# Patient Record
Sex: Male | Born: 2005 | Race: Black or African American | Hispanic: No | Marital: Single
Health system: Southern US, Community
[De-identification: ages and names within clinical notes are randomized; demographics above are authoritative.]

## PROBLEM LIST (undated history)

## (undated) DIAGNOSIS — F329 Major depressive disorder, single episode, unspecified: Secondary | ICD-10-CM

## (undated) DIAGNOSIS — H539 Unspecified visual disturbance: Secondary | ICD-10-CM

## (undated) DIAGNOSIS — F909 Attention-deficit hyperactivity disorder, unspecified type: Secondary | ICD-10-CM

## (undated) DIAGNOSIS — Z87898 Personal history of other specified conditions: Secondary | ICD-10-CM

## (undated) DIAGNOSIS — F419 Anxiety disorder, unspecified: Secondary | ICD-10-CM

## (undated) DIAGNOSIS — T7840XA Allergy, unspecified, initial encounter: Secondary | ICD-10-CM

## (undated) DIAGNOSIS — F913 Oppositional defiant disorder: Secondary | ICD-10-CM

## (undated) DIAGNOSIS — F32A Depression, unspecified: Secondary | ICD-10-CM

## (undated) DIAGNOSIS — R4689 Other symptoms and signs involving appearance and behavior: Secondary | ICD-10-CM

## (undated) HISTORY — DX: Attention-deficit hyperactivity disorder, unspecified type: F90.9

## (undated) NOTE — *Deleted (*Deleted)
Emergency Medicine Provider Progress Note  Patient seen on rounds today  [name] is a 53 y.o. male who initially presented to the ED for complaints of Psychiatric Evaluation   Currently, the patient is resting in bed in no acute distress.  ED Course   10:47 AM   Vitals  BP (!) 134/80 (BP Location: Right Arm)   Pulse 87   Temp (!) 97.5 F (36.4 C) (Oral)   Resp 16   Wt 140 lb 14 oz (63.9 kg)   SpO2 99%   Physical Exam Constitutional:      General: He is not in acute distress.    Appearance: Normal appearance. He is not ill-appearing.  Cardiovascular:     Rate and Rhythm: Normal rate.  Pulmonary:     Effort: Pulmonary effort is normal.  Neurological:     General: No focal deficit present.     Mental Status: He is alert.  Psychiatric:        Mood and Affect: Mood normal.        Behavior: Behavior normal.      Labs    Results for orders placed or performed during the hospital encounter of 07/17/20  Resp Panel by RT PCR (RSV, Flu A&B, Covid) - Nasopharyngeal Swab   Specimen: Nasopharyngeal Swab  Result Value Ref Range   SARS Coronavirus 2 by RT PCR NEGATIVE NEGATIVE   Influenza A by PCR NEGATIVE NEGATIVE   Influenza B by PCR NEGATIVE NEGATIVE   Respiratory Syncytial Virus by PCR NEGATIVE NEGATIVE  Urine rapid drug screen (hosp performed)  Result Value Ref Range   Opiates NONE DETECTED NONE DETECTED   Cocaine NONE DETECTED NONE DETECTED   Benzodiazepines NONE DETECTED NONE DETECTED   Amphetamines NONE DETECTED NONE DETECTED   Tetrahydrocannabinol NONE DETECTED NONE DETECTED   Barbiturates NONE DETECTED NONE DETECTED  Salicylate level  Result Value Ref Range   Salicylate Lvl <7.0 (L) 7.0 - 30.0 mg/dL  Comprehensive metabolic panel  Result Value Ref Range   Sodium 139 135 - 145 mmol/L   Potassium 4.2 3.5 - 5.1 mmol/L   Chloride 106 98 - 111 mmol/L   CO2 26 22 - 32 mmol/L   Glucose, Bld 98 70 - 99 mg/dL   BUN 9 4 - 18 mg/dL   Creatinine, Ser 1.61 0.50 -  1.00 mg/dL   Calcium 9.2 8.9 - 09.6 mg/dL   Total Protein 6.6 6.5 - 8.1 g/dL   Albumin 4.0 3.5 - 5.0 g/dL   AST 18 15 - 41 U/L   ALT 10 0 - 44 U/L   Alkaline Phosphatase 131 52 - 171 U/L   Total Bilirubin 0.8 0.3 - 1.2 mg/dL   GFR, Estimated NOT CALCULATED >60 mL/min   Anion gap 7 5 - 15  Acetaminophen level  Result Value Ref Range   Acetaminophen (Tylenol), Serum <10 (L) 10 - 30 ug/mL  Ethanol  Result Value Ref Range   Alcohol, Ethyl (B) <10 <10 mg/dL  CBC with Diff  Result Value Ref Range   WBC 6.1 4.5 - 13.5 K/uL   RBC 4.62 3.80 - 5.70 MIL/uL   Hemoglobin 13.5 12.0 - 16.0 g/dL   HCT 04.5 36 - 49 %   MCV 85.3 78.0 - 98.0 fL   MCH 29.2 25.0 - 34.0 pg   MCHC 34.3 31.0 - 37.0 g/dL   RDW 40.9 81.1 - 91.4 %   Platelets 211 150 - 400 K/uL   nRBC 0.0 0.0 - 0.2 %  Neutrophils Relative % 58 %   Neutro Abs 3.5 1.7 - 8.0 K/uL   Lymphocytes Relative 24 %   Lymphs Abs 1.4 1.1 - 4.8 K/uL   Monocytes Relative 13 %   Monocytes Absolute 0.8 0.2 - 1.2 K/uL   Eosinophils Relative 4 %   Eosinophils Absolute 0.3 0.0 - 1.2 K/uL   Basophils Relative 1 %   Basophils Absolute 0.0 0.0 - 0.1 K/uL   Immature Granulocytes 0 %   Abs Immature Granulocytes 0.02 0.00 - 0.07 K/uL   No results found.    Radiology  @ENCIMAGES @   Plan  Current plan is for ***.  There are no questions and answers to display.     Lewis Moccasin MD   I,Hamilton Brunilda Payor, acting as a scribe for Lewis Moccasin MD, have documented all relevant information on their behalf and as directed by while in their presence.

---
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
## 2023-04-22 IMAGING — US US RENAL
1 series · 14 of 25 positions shown · non-contrast
Comparison: None.

CLINICAL DATA: Hematuria.

EXAM:
RENAL / URINARY TRACT ULTRASOUND COMPLETE

[Series 1: [facility] renal · 14 of 44 slices shown]
[im 1/44]
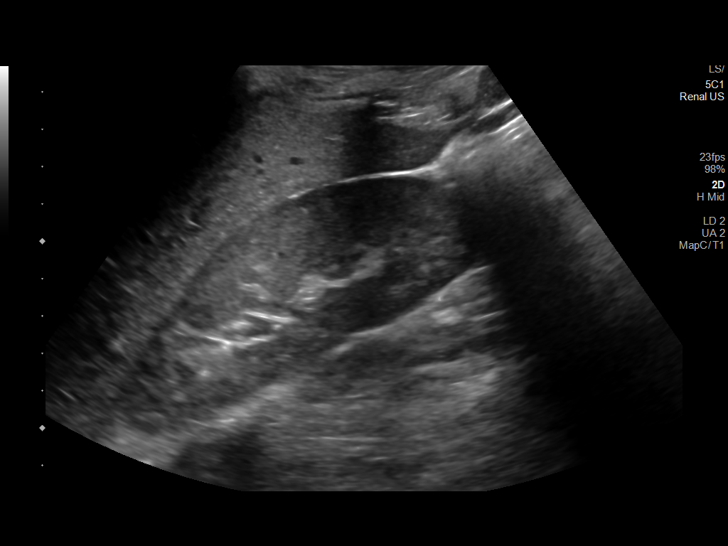
[im 4/44]
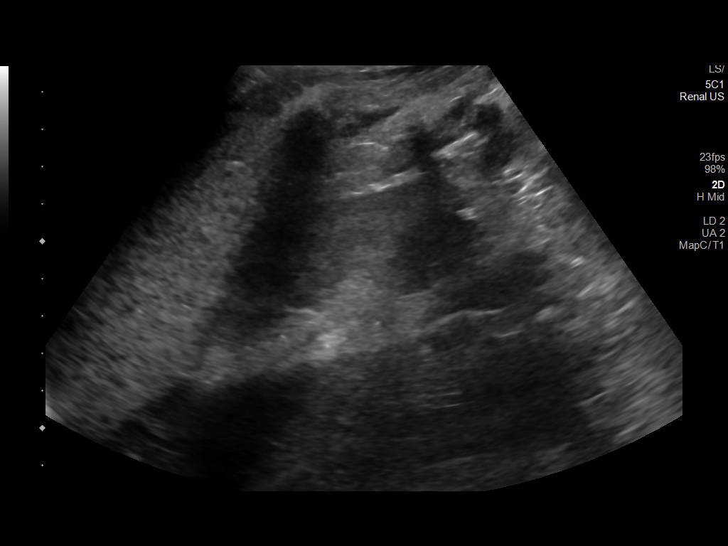
[im 8/44]
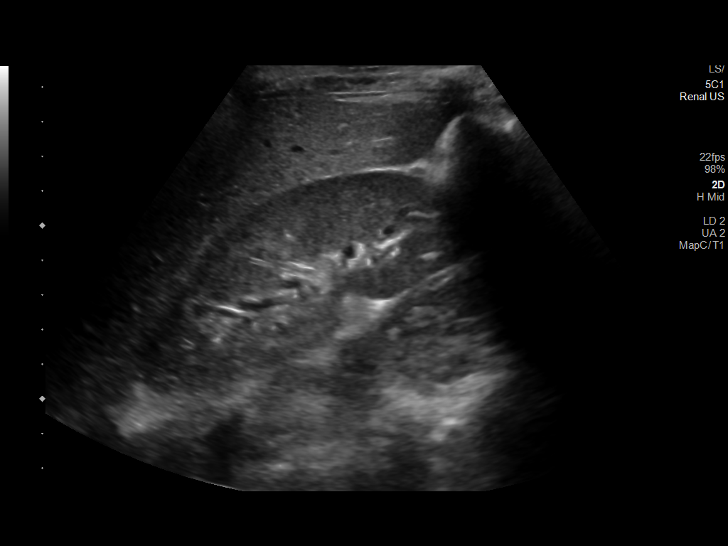
[im 11/44]
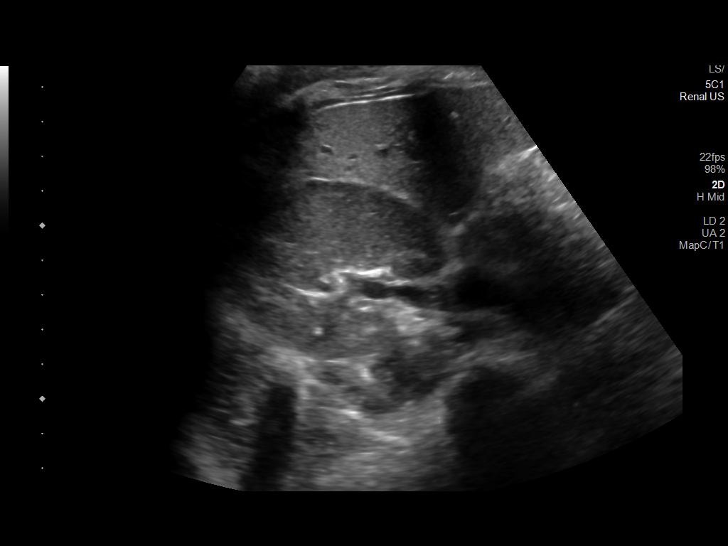
[im 15/44]
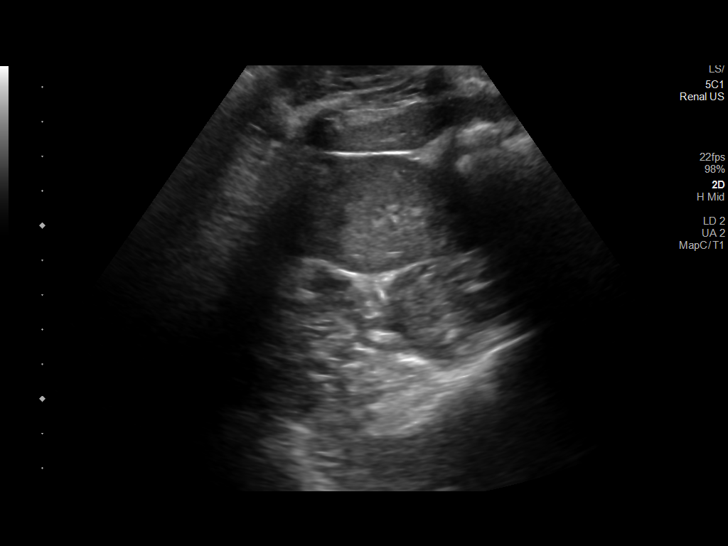
[im 17/44]
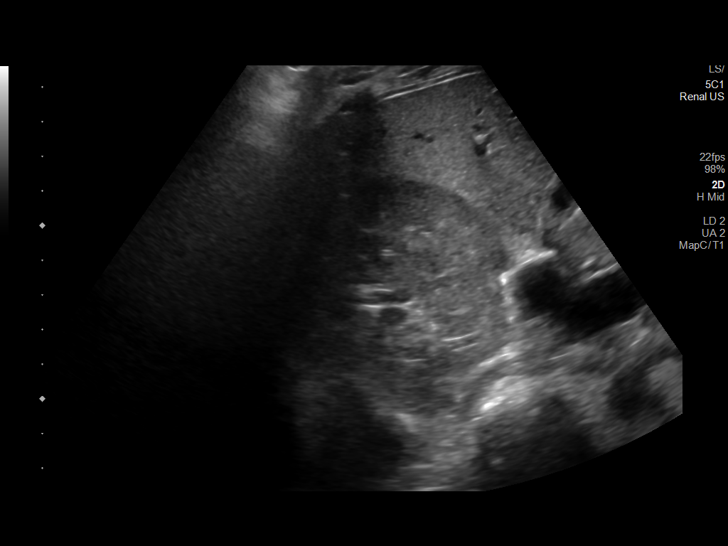
[im 20/44]
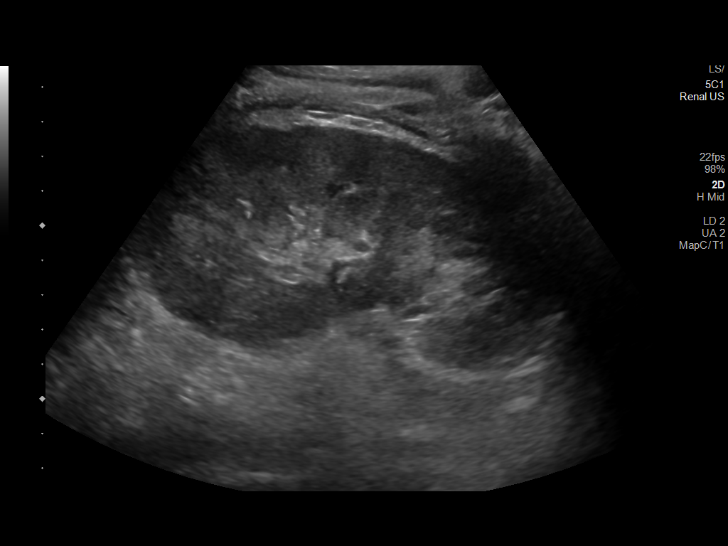
[im 24/44]
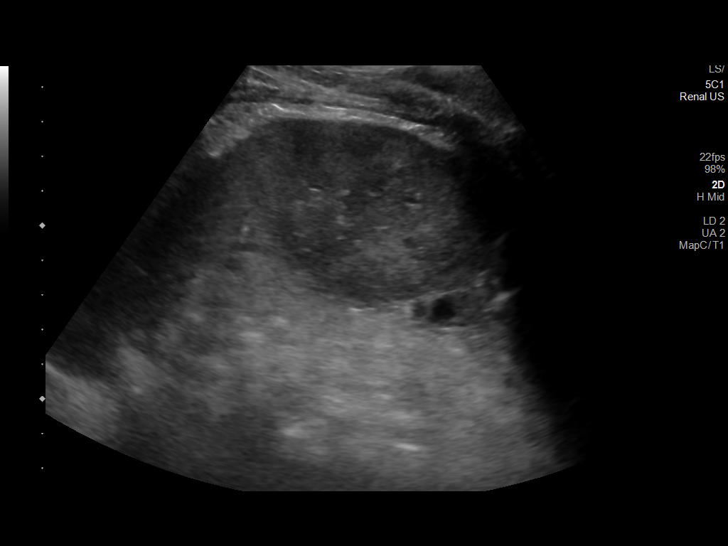
[im 27/44]
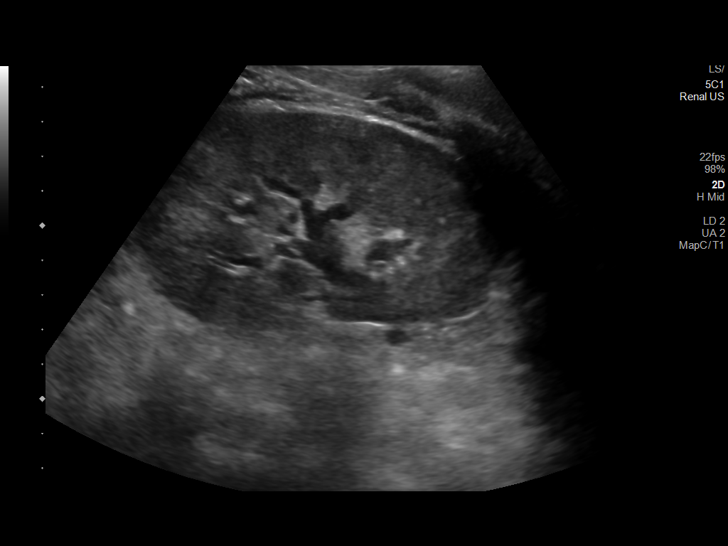
[im 29/44]
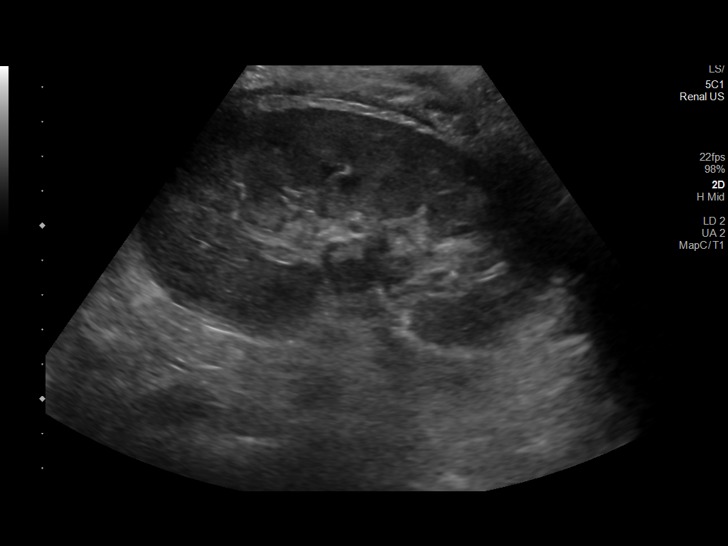
[im 33/44]
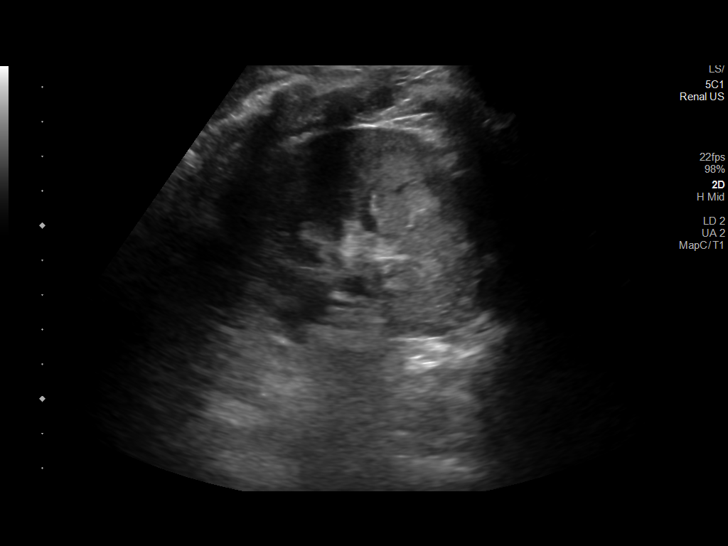
[im 36/44]
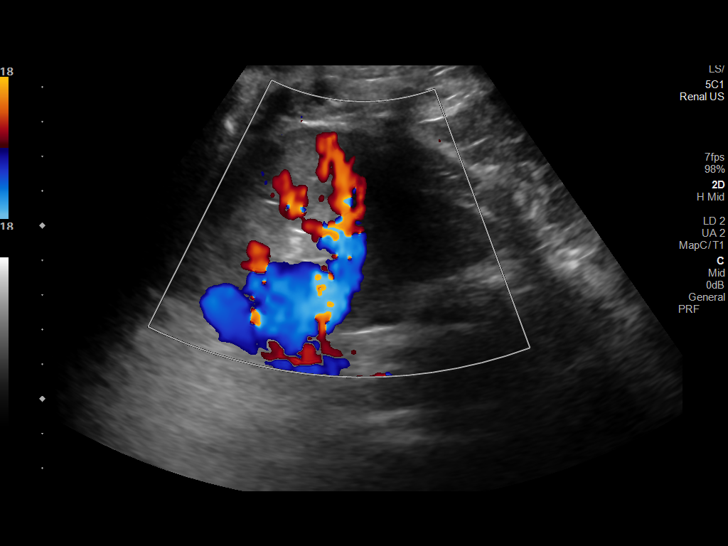
[im 40/44]
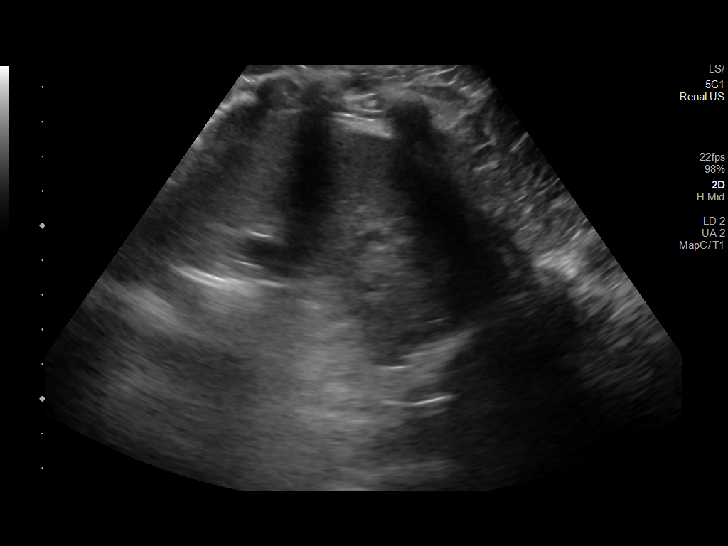
[im 44/44]
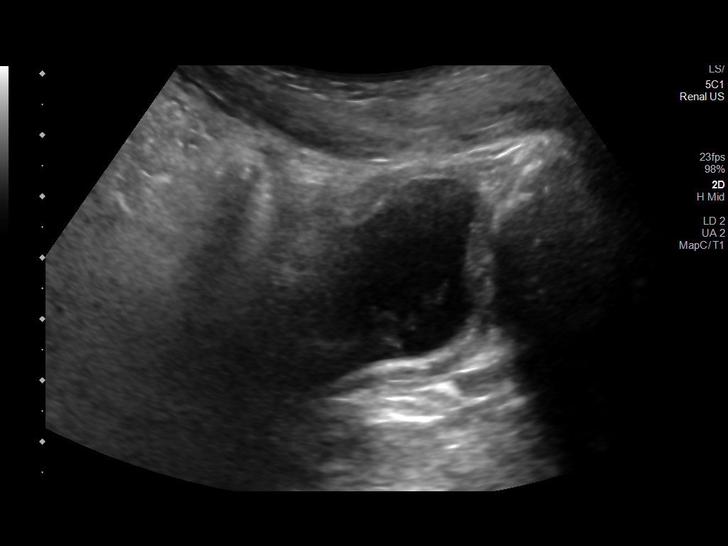

[14 of 25 positions shown; findings below may reference images not displayed]

FINDINGS: Right Kidney:

Renal measurements: 10.4 cm x 4.0 cm x 4.2 cm = volume: 91.11 mL.
Echogenicity within normal limits. No mass or hydronephrosis
visualized.

Left Kidney:

Renal measurements: 11.7 cm x 5.8 cm x 5.1 cm = volume: 183.58 mL.
Echogenicity within normal limits. No mass or hydronephrosis
visualized.

Bladder:

Echogenic debris is seen within the bladder lumen.

Other:

None.
IMPRESSION: 1. Echogenic debris within the bladder lumen which may represent a
mild amount of blood products.
2. Otherwise unremarkable renal ultrasound.
